# Patient Record
Sex: Male | Born: 2000 | Race: White | Hispanic: Yes | Marital: Single | Smoking: Never smoker
Health system: Southern US, Community
[De-identification: ages and names within clinical notes are randomized; demographics above are authoritative.]

---
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
## 2022-06-24 DIAGNOSIS — Z113 Encounter for screening for infections with a predominantly sexual mode of transmission: Secondary | ICD-10-CM | POA: Diagnosis not present

## 2022-06-24 DIAGNOSIS — Z0001 Encounter for general adult medical examination with abnormal findings: Secondary | ICD-10-CM | POA: Diagnosis not present

## 2022-06-24 DIAGNOSIS — Z1329 Encounter for screening for other suspected endocrine disorder: Secondary | ICD-10-CM | POA: Diagnosis not present

## 2022-06-24 DIAGNOSIS — J301 Allergic rhinitis due to pollen: Secondary | ICD-10-CM | POA: Diagnosis not present

## 2022-06-24 DIAGNOSIS — Z1159 Encounter for screening for other viral diseases: Secondary | ICD-10-CM | POA: Diagnosis not present

## 2022-06-24 DIAGNOSIS — Z136 Encounter for screening for cardiovascular disorders: Secondary | ICD-10-CM | POA: Diagnosis not present

## 2022-07-22 DIAGNOSIS — J301 Allergic rhinitis due to pollen: Secondary | ICD-10-CM | POA: Diagnosis not present

## 2022-07-22 DIAGNOSIS — E8809 Other disorders of plasma-protein metabolism, not elsewhere classified: Secondary | ICD-10-CM | POA: Diagnosis not present

## 2022-07-22 DIAGNOSIS — E782 Mixed hyperlipidemia: Secondary | ICD-10-CM | POA: Diagnosis not present

## 2022-10-21 DIAGNOSIS — E782 Mixed hyperlipidemia: Secondary | ICD-10-CM | POA: Diagnosis not present

## 2022-10-21 DIAGNOSIS — J301 Allergic rhinitis due to pollen: Secondary | ICD-10-CM | POA: Diagnosis not present

## 2022-10-21 DIAGNOSIS — E8809 Other disorders of plasma-protein metabolism, not elsewhere classified: Secondary | ICD-10-CM | POA: Diagnosis not present

## 2023-03-30 DIAGNOSIS — R635 Abnormal weight gain: Secondary | ICD-10-CM | POA: Diagnosis not present

## 2023-03-30 DIAGNOSIS — E782 Mixed hyperlipidemia: Secondary | ICD-10-CM | POA: Diagnosis not present
